# Patient Record
Sex: Male | Born: 1990 | Race: Black or African American | Hispanic: No | Marital: Single | Smoking: Current every day smoker
Health system: Southern US, Community
[De-identification: ages and names within clinical notes are randomized; demographics above are authoritative.]

---
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
## 2009-03-31 ENCOUNTER — Telehealth (INDEPENDENT_AMBULATORY_CARE_PROVIDER_SITE_OTHER): Payer: Self-pay | Admitting: *Deleted

## 2009-05-21 ENCOUNTER — Ambulatory Visit: Payer: Self-pay | Admitting: Family Medicine

## 2009-06-03 ENCOUNTER — Telehealth: Payer: Self-pay | Admitting: Family Medicine

## 2009-06-03 DIAGNOSIS — F319 Bipolar disorder, unspecified: Secondary | ICD-10-CM | POA: Insufficient documentation

## 2009-06-20 ENCOUNTER — Encounter (INDEPENDENT_AMBULATORY_CARE_PROVIDER_SITE_OTHER): Payer: Self-pay | Admitting: *Deleted

## 2009-06-20 DIAGNOSIS — F17200 Nicotine dependence, unspecified, uncomplicated: Secondary | ICD-10-CM

## 2010-06-14 ENCOUNTER — Encounter: Payer: Self-pay | Admitting: Family Medicine

## 2010-10-14 NOTE — Miscellaneous (Signed)
   Clinical Lists Changes  Problems: Changed problem from ASTHMA, UNSPECIFIED (ICD-493.90) to ASTHMA, INTERMITTENT (ICD-493.90) 

## 2012-05-24 ENCOUNTER — Emergency Department (INDEPENDENT_AMBULATORY_CARE_PROVIDER_SITE_OTHER)
Admission: EM | Admit: 2012-05-24 | Discharge: 2012-05-24 | Disposition: A | Discharge status: Home / Self Care | Payer: Self-pay | Source: Home / Self Care | Attending: Family Medicine | Admitting: Family Medicine

## 2012-05-24 ENCOUNTER — Encounter (HOSPITAL_COMMUNITY): Payer: Self-pay | Admitting: *Deleted

## 2012-05-24 DIAGNOSIS — R220 Localized swelling, mass and lump, head: Secondary | ICD-10-CM

## 2012-05-24 DIAGNOSIS — K130 Diseases of lips: Secondary | ICD-10-CM

## 2012-05-24 MED ORDER — IBUPROFEN 600 MG PO TABS: 600.0000 mg | ORAL_TABLET | Freq: Three times a day (TID) | ORAL | Status: AC

## 2012-05-24 MED ORDER — HYDROXYZINE HCL 25 MG PO TABS: 25.0000 mg | ORAL_TABLET | Freq: Four times a day (QID) | ORAL | Status: AC

## 2012-05-24 NOTE — ED Notes (Addendum)
Pt is here with complaints of swollen lower lip with onset yesterday.  Denies any known allergy or exposure.  Denies difficulty breathing or swallowing.
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
## 2016-12-02 ENCOUNTER — Encounter (HOSPITAL_COMMUNITY): Payer: Self-pay | Admitting: *Deleted

## 2016-12-02 ENCOUNTER — Emergency Department (HOSPITAL_COMMUNITY)
Admission: EM | Admit: 2016-12-02 | Discharge: 2016-12-02 | Disposition: A | Discharge status: Home / Self Care | Payer: Self-pay | Attending: Emergency Medicine | Admitting: Emergency Medicine

## 2016-12-02 DIAGNOSIS — R300 Dysuria: Secondary | ICD-10-CM | POA: Insufficient documentation

## 2016-12-02 DIAGNOSIS — J45909 Unspecified asthma, uncomplicated: Secondary | ICD-10-CM | POA: Insufficient documentation

## 2016-12-02 DIAGNOSIS — Z202 Contact with and (suspected) exposure to infections with a predominantly sexual mode of transmission: Secondary | ICD-10-CM | POA: Insufficient documentation

## 2016-12-02 DIAGNOSIS — F909 Attention-deficit hyperactivity disorder, unspecified type: Secondary | ICD-10-CM | POA: Insufficient documentation

## 2016-12-02 DIAGNOSIS — F17200 Nicotine dependence, unspecified, uncomplicated: Secondary | ICD-10-CM | POA: Insufficient documentation

## 2016-12-02 DIAGNOSIS — R369 Urethral discharge, unspecified: Secondary | ICD-10-CM | POA: Insufficient documentation

## 2016-12-02 MED ORDER — AZITHROMYCIN 250 MG PO TABS
1000.0000 mg | ORAL_TABLET | Freq: Once | ORAL | Status: AC
  Administered 2016-12-02: 1000 mg via ORAL
  Filled 2016-12-02: qty 4

## 2016-12-02 MED ORDER — CEFTRIAXONE SODIUM 250 MG IJ SOLR
250.0000 mg | Freq: Once | INTRAMUSCULAR | Status: AC
  Administered 2016-12-02: 250 mg via INTRAMUSCULAR
  Filled 2016-12-02: qty 250

## 2016-12-02 MED ORDER — STERILE WATER FOR INJECTION IJ SOLN
INTRAMUSCULAR | Status: AC
  Administered 2016-12-02: 2 mL
  Filled 2016-12-02: qty 10
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
## 2016-12-02 NOTE — ED Notes (Signed)
Pt stable, understands discharge instructions, and reasons for return.   

## 2016-12-02 NOTE — Discharge Instructions (Signed)
Follow up with the [facility] Department for further testing for Hepatitis, syphilis, HIV. The health department is free.

## 2016-12-02 NOTE — ED Provider Notes (Signed)
[facility]-EMERGENCY DEPT Provider Note   CSN: [id number] Arrival date & time: 12/02/16  1549  By signing my name below, I, Cameron Simpson, attest that this documentation has been prepared under the direction and in the presence of non-physician practitioner, Cameron BuffaloHope Neese, NP. Electronically Signed: Nelwyn SalisburyJoshua Simpson, Scribe. 12/02/2016. 4:08 PM.  History   Chief Complaint Chief Complaint  Patient presents with  . SEXUALLY TRANSMITTED DISEASE   The history is provided by the patient. No language interpreter was used.  Exposure to STD  This is a new problem. The current episode started more than 2 days ago. The problem occurs daily. The problem has not changed since onset.Associated symptoms include headaches. Pertinent negatives include no abdominal pain. Nothing aggravates the symptoms. Nothing relieves the symptoms. He has tried nothing for the symptoms. The treatment provided no relief.    HPI Comments:  Cameron Simpson is a 26 y.o. male who presents to the Emergency Department complaining of sudden-onset, intermittent penile discharge which he first noticed about a week ago. Pt describes his penile discharge as thick and yellow. He reports associated intermittent dysuria and headache. Pt's girlfriend was seen yesterday here in the ED and he states that she was dx with chlamydia. Pt notes that he has had unprotected sex with different partners. He denies any nausea, vomiting, fever, chills, abdominal pain, scrotal swelling, penile pain or back pain.    History reviewed. No pertinent past medical history.  Patient Active Problem List   Diagnosis Date Noted  . TOBACCO USER 06/20/2009  . BIPOLAR AFFECTIVE DISORDER 06/03/2009  . OBESITY, NOS 11/09/2006  . ATTENTION DEFICIT, W/O HYPERACTIVITY 11/09/2006  . ASTHMA, INTERMITTENT 11/09/2006    History reviewed. No pertinent surgical history.     Home Medications    Prior to Admission medications   Not on File    Family History History  reviewed. No pertinent family history.  Social History Social History  Substance Use Topics  . Smoking status: Current Every Day Smoker  . Smokeless tobacco: Not on file  . Alcohol use Yes     Allergies   Patient has no known allergies.   Review of Systems Review of Systems  Constitutional: Negative for chills and fever.  Gastrointestinal: Negative for abdominal pain, nausea and vomiting.  Genitourinary: Positive for discharge and dysuria. Negative for penile pain and scrotal swelling.  Neurological: Positive for headaches.     Physical Exam Updated Vital Signs BP 122/65 (BP Location: Left Arm)   Pulse 63   Temp 98.6 F (37 C) (Oral)   Resp 16   SpO2 97%   Physical Exam  Constitutional: He appears well-developed and well-nourished. No distress.  HENT:  Head: Normocephalic and atraumatic.  Eyes: EOM are normal.  Neck: Neck supple.  Cardiovascular: Normal rate and regular rhythm.   Pulmonary/Chest: Effort normal and breath sounds normal.  Abdominal: Soft. He exhibits no distension. There is no tenderness.  Genitourinary: Testes normal. Circumcised. No penile erythema or penile tenderness. Discharge found.  Genitourinary Comments: Circumcised male with clear discharge from penis. No inguinal lymphadenopathy.   Musculoskeletal: Normal range of motion.  Lymphadenopathy: No inguinal adenopathy noted on the right or left side.  Neurological: He is alert.  Skin: Skin is warm and dry.  Psychiatric: He has a normal mood and affect.  Nursing note and vitals reviewed.    ED Treatments / Results  DIAGNOSTIC STUDIES:  Oxygen Saturation is 99% on RA, normal by my interpretation.    COORDINATION OF CARE:  4:29  PM Discussed treatment plan with pt at bedside which includes discharge with abx (Rocefin and Zithromax) and pt agreed to plan.  Labs (all labs ordered are listed, but only abnormal results are displayed) Labs Reviewed  GC/CHLAMYDIA PROBE AMP ([place]) NOT  AT [facility]    Radiology No results found.  Procedures Procedures (including critical care time)  Medications Ordered in ED Medications  cefTRIAXone (ROCEPHIN) injection 250 mg (250 mg Intramuscular Given 12/02/16 1642)  azithromycin (ZITHROMAX) tablet 1,000 mg (1,000 mg Oral Given 12/02/16 1643)  sterile water (preservative free) injection (2 mLs  Given 12/02/16 1647)     Initial Impression / Assessment and Plan / ED Course  I have reviewed the triage vital signs and the nursing notes.   Final Clinical Impressions(s) / ED Diagnoses  26 y.o. male with dysuria and penile discharge stable for d/c to f/u with [facility]. Patient treated with Rocephin and Zithromax while in the ED. Cultures pending. Final diagnoses:  Penile discharge  Dysuria  STD exposure    New Prescriptions There are no discharge medications for this patient. I personally performed the services described in this documentation, which was scribed in my presence. The recorded information has been reviewed and is accurate.     [address] [place], [place] 12/03/16 1610    Jerelyn Scott, MD 12/03/16 [phone]

## 2016-12-02 NOTE — ED Triage Notes (Signed)
Pt reports having an std, has burning pain and penile discharge. No acute distress noted at triage.

## 2016-12-05 LAB — GC/CHLAMYDIA PROBE AMP (~~LOC~~) NOT AT ARMC
CHLAMYDIA, DNA PROBE: POSITIVE — AB
Neisseria Gonorrhea: NEGATIVE

## 2023-06-26 ENCOUNTER — Emergency Department (HOSPITAL_COMMUNITY)
Admission: EM | Admit: 2023-06-26 | Discharge: 2023-06-30 | Disposition: A | Discharge status: Home / Self Care | Payer: Self-pay | Attending: Emergency Medicine | Admitting: Emergency Medicine

## 2023-06-26 ENCOUNTER — Encounter (HOSPITAL_COMMUNITY): Payer: Self-pay | Admitting: Emergency Medicine

## 2023-06-26 ENCOUNTER — Other Ambulatory Visit: Payer: Self-pay

## 2023-06-26 DIAGNOSIS — F3110 Bipolar disorder, current episode manic without psychotic features, unspecified: Secondary | ICD-10-CM | POA: Diagnosis present

## 2023-06-26 DIAGNOSIS — R456 Violent behavior: Secondary | ICD-10-CM | POA: Insufficient documentation

## 2023-06-26 DIAGNOSIS — E876 Hypokalemia: Secondary | ICD-10-CM

## 2023-06-26 DIAGNOSIS — Z79899 Other long term (current) drug therapy: Secondary | ICD-10-CM | POA: Insufficient documentation

## 2023-06-26 DIAGNOSIS — R4689 Other symptoms and signs involving appearance and behavior: Secondary | ICD-10-CM

## 2023-06-26 DIAGNOSIS — F29 Unspecified psychosis not due to a substance or known physiological condition: Secondary | ICD-10-CM | POA: Insufficient documentation

## 2023-06-26 LAB — COMPREHENSIVE METABOLIC PANEL
ALT: 14 U/L (ref 0–44)
AST: 17 U/L (ref 15–41)
Albumin: 4.3 g/dL (ref 3.5–5.0)
Alkaline Phosphatase: 58 U/L (ref 38–126)
Anion gap: 8 (ref 5–15)
BUN: 11 mg/dL (ref 6–20)
CO2: 24 mmol/L (ref 22–32)
Calcium: 9.2 mg/dL (ref 8.9–10.3)
Chloride: 106 mmol/L (ref 98–111)
Creatinine, Ser: 1.17 mg/dL (ref 0.61–1.24)
GFR, Estimated: >60 mL/min (ref >60)
Glucose, Bld: 76 mg/dL (ref 70–99)
Potassium: 3.2 mmol/L — ABNORMAL LOW (ref 3.5–5.1)
Sodium: 138 mmol/L (ref 135–145)
Total Bilirubin: 1 mg/dL (ref 0.3–1.2)
Total Protein: 7.8 g/dL (ref 6.5–8.1)

## 2023-06-26 LAB — CBC
HCT: 46.9 % (ref 39.0–52.0)
Hemoglobin: 14.7 g/dL (ref 13.0–17.0)
MCH: 28.2 pg (ref 26.0–34.0)
MCHC: 31.3 g/dL (ref 30.0–36.0)
MCV: 90 fL (ref 80.0–100.0)
Platelets: 252 10*3/uL (ref 150–400)
RBC: 5.21 MIL/uL (ref 4.22–5.81)
RDW: 14 % (ref 11.5–15.5)
WBC: 7.1 10*3/uL (ref 4.0–10.5)
nRBC: 0 % (ref 0.0–0.2)

## 2023-06-26 LAB — RAPID URINE DRUG SCREEN, HOSP PERFORMED
Amphetamines: NOT DETECTED
Barbiturates: NOT DETECTED
Benzodiazepines: NOT DETECTED
Cocaine: NOT DETECTED
Opiates: NOT DETECTED
Tetrahydrocannabinol: POSITIVE — AB

## 2023-06-26 LAB — SALICYLATE LEVEL: Salicylate Lvl: <7 mg/dL — ABNORMAL LOW (ref 7.0–30.0)

## 2023-06-26 LAB — ACETAMINOPHEN LEVEL: Acetaminophen (Tylenol), Serum: <10 ug/mL — ABNORMAL LOW (ref 10–30)

## 2023-06-26 LAB — ETHANOL: Alcohol, Ethyl (B): <10 mg/dL (ref <10)

## 2023-06-26 MED ORDER — DIVALPROEX SODIUM 250 MG PO DR TAB
250.0000 mg | DELAYED_RELEASE_TABLET | Freq: Once | ORAL | Status: DC
  Filled 2023-06-26: qty 1

## 2023-06-26 MED ORDER — ZIPRASIDONE MESYLATE 20 MG IM SOLR
20.0000 mg | Freq: Once | INTRAMUSCULAR | Status: AC
  Administered 2023-06-26: 20 mg via INTRAMUSCULAR
  Filled 2023-06-26: qty 20

## 2023-06-26 MED ORDER — DIVALPROEX SODIUM ER 500 MG PO TB24
500.0000 mg | ORAL_TABLET | Freq: Every day | ORAL | Status: DC
  Administered 2023-06-26 – 2023-06-29 (×4): 500 mg via ORAL
  Filled 2023-06-26 (×4): qty 1

## 2023-06-26 MED ORDER — ONDANSETRON HCL 4 MG PO TABS: 4.0000 mg | ORAL_TABLET | Freq: Three times a day (TID) | ORAL | Status: DC | PRN

## 2023-06-26 MED ORDER — NICOTINE 14 MG/24HR TD PT24
14.0000 mg | MEDICATED_PATCH | Freq: Every day | TRANSDERMAL | Status: DC
  Filled 2023-06-26: qty 1

## 2023-06-26 MED ORDER — ALUM & MAG HYDROXIDE-SIMETH 200-200-20 MG/5ML PO SUSP: 30.0000 mL | Freq: Four times a day (QID) | ORAL | Status: DC | PRN

## 2023-06-26 MED ORDER — POTASSIUM CHLORIDE CRYS ER 20 MEQ PO TBCR
40.0000 meq | EXTENDED_RELEASE_TABLET | Freq: Once | ORAL | Status: AC
  Administered 2023-06-26: 40 meq via ORAL
  Filled 2023-06-26: qty 2

## 2023-06-26 MED ORDER — ACETAMINOPHEN 325 MG PO TABS
650.0000 mg | ORAL_TABLET | ORAL | Status: DC | PRN
  Administered 2023-06-27: 650 mg via ORAL
  Filled 2023-06-26: qty 2

## 2023-06-26 MED ORDER — RISPERIDONE 1 MG PO TABS
1.0000 mg | ORAL_TABLET | Freq: Every day | ORAL | Status: DC
  Administered 2023-06-26 – 2023-06-29 (×4): 1 mg via ORAL
  Filled 2023-06-26 (×4): qty 1

## 2023-06-26 MED ORDER — LORAZEPAM 1 MG PO TABS: 2.0000 mg | ORAL_TABLET | Freq: Once | ORAL | Status: DC

## 2023-06-26 MED ORDER — TRAZODONE HCL 50 MG PO TABS
50.0000 mg | ORAL_TABLET | Freq: Every evening | ORAL | Status: DC | PRN
  Administered 2023-06-26 – 2023-06-29 (×4): 50 mg via ORAL
  Filled 2023-06-26 (×4): qty 1
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
## 2023-06-26 NOTE — ED Provider Notes (Signed)
Emergency Medicine Observation Re-evaluation Note  Cameron Simpson is a 32 y.o. male, seen on rounds today.  Pt initially presented to the ED for complaints of Psychiatric Evaluation Currently, the patient is awaiting psychiatric evaluation.  Physical Exam  BP (!) 158/94 (BP Location: Right Arm)   Pulse 88   Temp 98.5 F (36.9 C) (Oral)   Resp 18   Ht 5' 11.5" (1.816 m)   Wt 97.6 kg   SpO2 96%   BMI 29.59 kg/m  Physical Exam    ED Course / MDM  EKG:   I have reviewed the labs performed to date as well as medications administered while in observation.  Recent changes in the last 24 hours include none.  Plan  Current plan is for psychiatric eval.    Bethann Berkshire, MD 06/26/23 [phone]

## 2023-06-26 NOTE — Progress Notes (Signed)
LCSW Progress Note  [id number]   Cameron Simpson  06/26/2023  1:56 PM  Description:   Inpatient Psychiatric Referral  Patient was recommended inpatient per Dahlia Byes, NP. There are no available beds at [facility], per [facility] [name], RN. Patient was referred to the following out of network facilities:   Destination  Service Provider Address Phone Fax  [facility]  [address], [place] [address] [phone]  [facility] [address]  [address], [place] [address] [phone]  [facility]  [address] [place], [place] [address] [phone]  [facility]  [address] [place], [place] [address] [phone]  [facility]  [address] [place] [address] [phone]  [facility]  [address] [place], [place] [address] [phone]  [facility]  [address] [place] [address] [phone]  [facility]  [address] [place] [address] [phone]  [facility]  [address] [place] [address] [phone]  [facility]  [address] [place] [address] [phone]  [facility]  [address] [place] [address] [phone]  [facility]  [place] [phone]  [facility] EFAX  [address] [place], [place] [phone]  [facility]  [address], [place] [address] [phone]  [facility]  [address] [place] [address] [phone]  [facility]  [address] [place], [place] [address] [phone]  [phone]  [facility]  [address] [place] [address] [phone]  [facility] EFAX  [place] [phone]    Situation ongoing, CSW to continue following and update chart as more information becomes available.      Cathie Beams, MSW, LCSW  06/26/2023 1:56 PM

## 2023-06-26 NOTE — ED Notes (Signed)
[facility] received a call from pts mother saying that she had gone to court to request a protective order which was approved. Pt is expected to be served here in the ED by [employer]. Pts mother would like to be notified of pts admission to an inpatient facility and discharge.  Jacquelynn Cree, [facility]  06/26/23
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
## 2023-06-26 NOTE — ED Notes (Signed)
[facility] called pts probation officer (Officer Godwin: [phone]) for collateral and to inform him that pt is here in the ED. The probation voicemail system is currently down and not able to accept messages. [facility] will try again.   Jacquelynn Cree, [facility]  06/26/23

## 2023-06-26 NOTE — ED Notes (Signed)
ED Provider at bedside. 

## 2023-06-26 NOTE — ED Notes (Signed)
Pt extremely aggressive, stating he will leave. Pt currently under IVC. Yelling in hall outside of room, cussing at staff. Security present. Medication orders obtained from [facility] MD. Pt stating he will spit at staff during administration of med.
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
## 2023-06-26 NOTE — ED Notes (Signed)
Unable to complete TTS as this time due to medication administration and pt aggressive behavior.

## 2023-06-26 NOTE — ED Notes (Signed)
Pt refusing to provide urine at this time. Using profanity towards this RN and police as bedside. Belongings collected per protocol. Pt aggressive and uncooperative at this time.
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
## 2023-06-26 NOTE — BH Assessment (Signed)
Per RN, Pt received medication for aggression and is currently unable to participate in tele-assessment.   Pamalee Leyden, [facility], [facility] Triage Specialist

## 2023-06-26 NOTE — ED Notes (Signed)
[employer] no longer at bedside. Pt agreeable to remain calm and cooperative at this time. Water given to pt per request.
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
## 2023-06-27 DIAGNOSIS — R4689 Other symptoms and signs involving appearance and behavior: Secondary | ICD-10-CM
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
## 2023-06-27 NOTE — ED Notes (Signed)
[facility] called Officer Courtney Paris, pts probation officer to inform them that pt is in the ED and is recommended for inpatient admission. The voicemail system at probation is still not working. [facility] was unable to leave a message. Will try again.   Jacquelynn Cree, [facility]   06/27/23

## 2023-06-27 NOTE — Progress Notes (Signed)
LCSW Progress Note  [id number]   Cameron Simpson  06/27/2023  10:55 AM  Description:   Inpatient Psychiatric Referral  Patient was recommended inpatient per Dahlia Byes, NP. There are no available beds at [facility], per [facility] [name], RN. Patient was referred to the following out of network facilities:   Destination  Service Provider Address Phone Fax  [facility]  [address] [place] [address] [phone]  [facility] [address]  [address], [place] [address] [phone]  [facility]  [address] [place], [place] [address] [phone]  [facility]  [address] [place], [place] [address] [phone]  [facility]  [address], [place] [address] [phone]  [facility]  [address] [place], [place] [address] [phone]  [facility]  [address] [place] [address] [phone]  [facility]  [address] [place] [address] [phone]  [facility]  [address] [place] [address] [phone]  [facility]  [address] [place] [address] [phone]  [facility]  [address] [place] [address] [phone]  [facility]  [place] [phone]  [facility]  [address] [place], [place] [phone]  [facility]  [address] [place] [address] [phone]  [facility]  [address], [place] [address] [phone]  [facility]  [address] [place], [place] [address] [phone]  [facility]  [address] [place] [address] [phone]  [facility] Inpatient EFAX  [place] [phone]    Situation ongoing, CSW to continue following and update chart as more information becomes available.    Cathie Beams, MSW, LCSW  06/27/2023 10:55 AM

## 2023-06-27 NOTE — ED Provider Notes (Signed)
Emergency Medicine Observation Re-evaluation Note  Cameron Simpson is a 32 y.o. male, seen on rounds today.  Pt initially presented to the ED for complaints of Psychiatric Evaluation Currently, the patient is asleep in bed.  Physical Exam  BP 129/77 (BP Location: Right Arm)   Pulse (!) 58   Temp 98.5 F (36.9 C) (Oral)   Resp 18   Ht 5' 11.5" (1.816 m)   Wt 97.6 kg   SpO2 100%   BMI 29.59 kg/m  Physical Exam General: Asleep, no acute distress Cardiac: Regular rate Lungs: No increased work of breathing Psych: Calm, sleep  ED Course / MDM  EKG:   I have reviewed the labs performed to date as well as medications administered while in observation.  Recent changes in the last 24 hours include patient remains medically cleared.  Recommended for inpatient psych, pending placement.  Plan  Current plan is for inpatient psych.    Rexford Maus, DO 06/27/23 [phone]

## 2023-06-28 NOTE — Progress Notes (Addendum)
ADDENDUM  2:35 PM - CSW received return phone call from Joe, intake coordinator, at [facility]. Pt has been denied by [facility] due to high acuity. CSW will continue to assist with placement.  12:36 PM - CSW received phone call from Joe, intake coordinator, at [facility]. Pt is currently under review for inpatient treatment. CSW sent IVC paperwork via fax to Joe for review. CSW will await follow up from Joe.  Cathie Beams, MSW, LCSW  06/28/2023 12:37 PM

## 2023-06-28 NOTE — Progress Notes (Signed)
LCSW Progress Note  [id number]   Cameron Simpson  06/28/2023  11:05 AM  Description:   Inpatient Psychiatric Referral  Patient was recommended inpatient per Phebe Colla, NP. There are no available beds at [facility], per [facility] [name], RN. Patient was referred to the following out of network facilities:   Destination  Service Provider Address Phone Fax  [facility]  [address], [place] [address] [phone]  [facility] [address]  [address] [place] [address] [phone]  [facility]  [address] [place], [place] [address] [phone]  [facility]  [address] [place], [place] [address] [phone]  [facility]  [address], [place] [address] [phone]  [facility]  [address] [place], [place] [address] [phone]  [facility]  [address] [place] [address] [phone]  [facility]  [address] [place] [address] [phone]  [facility]  [address], [place] [address] [phone]  [facility]  [address], [place] [address] [phone]  [facility]  [address] [place] [address] [phone]  [facility] B[facility] Management Behavioral Health  [place] [phone]  [facility] EFAX  [address] [place], [place] [phone]  [facility]  [address], [place] [address] [phone]  [facility]  [address] [place] [address] [phone]  [facility]  [address] [place], [place] [address] [phone]   [facility]  [address], [place] [address] [phone]  [facility] Psych[facility] Inpatient EFAX  [place] [phone]    Situation ongoing, CSW to continue following and update chart as more information becomes available.      Cathie Beams, MSW, LCSW  06/28/2023 11:05 AM

## 2023-06-28 NOTE — ED Notes (Signed)
Pt very cooperative with medication compliance, even inquired if these would be the medications he goes home with at d/c. Pt has had no behavior issues this shift.

## 2023-06-28 NOTE — ED Notes (Signed)
Patient is calm and cooperative, laying in bed. Awaiting psych MD rounds this Am.
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
## 2023-06-28 NOTE — ED Provider Notes (Signed)
Emergency Medicine Observation Re-evaluation Note  Cameron Simpson is a 32 y.o. male, seen on rounds today.  Pt initially presented to the ED for complaints of Psychiatric Evaluation Currently, the patient is sleeping.  Physical Exam  BP 106/69 (BP Location: Right Arm)   Pulse (!) 56   Temp 97.8 F (36.6 C) (Oral)   Resp 18   Ht 5' 11.5" (1.816 m)   Wt 97.6 kg   SpO2 100%   BMI 29.59 kg/m  Physical Exam General: Sleeping, no acute distress Lungs: No respiratory distress Psych: Resting comfortably  ED Course / MDM  EKG:   I have reviewed the labs performed to date as well as medications administered while in observation.  Recent changes in the last 24 hours include no acute events.  Plan  Current plan is for psychiatric placement per psychiatry recommendation.    Durwin Glaze, MD 06/28/23 [phone]
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
## 2023-06-29 NOTE — ED Provider Notes (Signed)
Emergency Medicine Observation Re-evaluation Note  Cameron Simpson is a 32 y.o. male, seen on rounds today.  Pt initially presented to the ED for complaints of Psychiatric Evaluation Currently, the patient is resting.  Physical Exam  BP 115/67 (BP Location: Right Arm)   Pulse (!) 59   Temp 98.5 F (36.9 C) (Oral)   Resp 18   Ht 1.816 m (5' 11.5")   Wt 97.6 kg   SpO2 100%   BMI 29.59 kg/m  Physical Exam General: nad   ED Course / MDM  EKG:   I have reviewed the labs performed to date as well as medications administered while in observation.  Recent changes in the last 24 hours include no events.  Plan  Current plan is for inpt tx.    Linwood Dibbles, MD 06/29/23 [phone]

## 2023-06-29 NOTE — Progress Notes (Signed)
LCSW Progress Note  [id number]   Cameron Simpson  06/29/2023  1:40 AM    Inpatient Behavioral Health Placement  CSW sent referral to out of network providers:  Destination  Service Provider Address Phone Fax  [facility]  [address] [place] [address] [phone]  [facility] [address]  [address] [place] [address] [phone]  [facility] [address] [place]  [address] [place], [place] [address] [phone]  [facility]  [address] [place], [place] [address] [phone]  [facility]  [address], [place] [address] [phone]  [facility]  [address] [place], [place] [address] [phone]  [facility]  [address] [place] [address] [phone]  [facility]  [address] [place] [address] [phone]  [facility]  [address] [place] [address] [phone]  [facility]  [address] [place] [address] [phone]  [facility]  [address] [place] [address] [phone]  [facility]  [place] [phone]  [facility] EFAX  [address] [place], [place] [phone]  [facility]  [address] [place] [address] [phone]  [facility]  [address], [place] [address] [phone]  [facility]  [address] [place] [address] [phone]  [facility]  [address] [place] [address] [phone]  [facility]  Psychiatry Inpatient [place]  [place] [phone]  [facility] Patient Placement  [facility] [place], [place] [phone]  [facility]  [address] [place] [address] [phone]  [facility]  [address], [place] [address] [phone]  [facility]  [address] [place], [place] [address] [phone]  [facility]  [address], [place] [address] [phone]  [facility]  [address], [place] [address] [phone]  [facility]  [address], [place] [address] [phone]    Situation ongoing,  CSW will follow up.    Maryjean Ka, MSW, LCSWA 06/29/2023 1:40 AM

## 2023-06-30 DIAGNOSIS — F319 Bipolar disorder, unspecified: Secondary | ICD-10-CM

## 2023-06-30 NOTE — Discharge Instructions (Addendum)
Discharge recommendations:  Patient is to take medications as prescribed. Please see information for follow-up appointment with psychiatry and therapy. Please follow up with your primary care provider for all medical related needs.   Therapy: We recommend that patient participate in individual therapy to address mental health concerns.  Medications: The patient or guardian is to contact a medical professional and/or outpatient provider to address any new side effects that develop. The patient or guardian should update outpatient providers of any new medications and/or medication changes.   Atypical antipsychotics: If you are prescribed an atypical antipsychotic, it is recommended that your height, weight, BMI, blood pressure, fasting lipid panel, and fasting blood sugar be monitored by your outpatient providers.  Safety:  The patient should abstain from use of illicit substances/drugs and abuse of any medications. If symptoms worsen or do not continue to improve or if the patient becomes actively suicidal or homicidal then it is recommended that the patient return to the closest hospital emergency department, the [facility], or call 911 for further evaluation and treatment. National Suicide Prevention Lifeline 1-800-SUICIDE or 231-710-7121.  About 988 988 offers 24/7 access to trained crisis counselors who can help people experiencing mental health-related distress. People can call or text 988 or chat 988lifeline.org for themselves or if they are worried about a loved one who may need crisis support.  Crisis Mobile: [facility]:                     (508)170-1118 (for crisis response 24 hours a day) [facility] Hotline:                                            (360) 088-6544   Safety Plan ALEKI HAFEN will reach out to his grandmother Mrs Kearney Hard., call 911 or call mobile crisis, or go to nearest emergency room if condition worsens or if  suicidal thoughts become active Patients' will follow up with behavioral health urgent care for outpatient psychiatric services (therapy/medication management).  The suicide prevention education provided includes the following: Suicide risk factors Suicide prevention and interventions National Suicide Hotline telephone number [facility] assessment telephone number [facility] Emergency Assistance 911 [facility] and/or Residential Mobile Crisis Unit telephone number Request made of family/significant other to:  grandmother Mrs Gena Fray weapons (e.g., guns, rifles, knives), all items previously/currently identified as safety concern.   Remove drugs/medications (over the counter, prescriptions, illicit drugs), all items previously/currently identified as a safety concern.

## 2023-06-30 NOTE — ED Notes (Signed)
Pts mother called [facility] to inquire about pts disposition. Pts mother spoke with pts grandmother who has agreed to house pt upon discharge. Pts mother is fine with the arrangement. [facility] assured pts mother that it would be reiterated to pt that he is not allowed to have contact with his mother due to the protective order being place and also will be encouraged to follow up with outpatient treatment and medication.   Pts mother said that she has been diagnosed bipolar so she understands the importance of taking medication. Pts mother was appreciative of the information.  Jacquelynn Cree, [facility]  06/30/23

## 2023-06-30 NOTE — ED Provider Notes (Signed)
Emergency Medicine Observation Re-evaluation Note  Cameron Simpson is a 32 y.o. male, seen on rounds today.  Pt initially presented to the ED for complaints of Psychiatric Evaluation Currently, the patient is medically cleared awaiting inpatient psychiatric admission.  Physical Exam  BP 101/68 (BP Location: Right Arm)   Pulse 60   Temp 98.1 F (36.7 C) (Oral)   Resp 20   Ht 5' 11.5" (1.816 m)   Wt 97.6 kg   SpO2 100%   BMI 29.59 kg/m  Physical Exam Vitals and nursing note reviewed.  Constitutional:      General: He is not in acute distress.    Appearance: He is well-developed. He is not diaphoretic.  HENT:     Head: Normocephalic and atraumatic.  Eyes:     Extraocular Movements: EOM normal.     Conjunctiva/sclera: Conjunctivae normal.  Cardiovascular:     Rate and Rhythm: Normal rate and regular rhythm.     Heart sounds: Normal heart sounds. No murmur heard.    No friction rub. No gallop.  Pulmonary:     Effort: Pulmonary effort is normal. No respiratory distress.     Breath sounds: Normal breath sounds. No wheezing or rales.  Abdominal:     General: There is no distension.     Palpations: Abdomen is soft.     Tenderness: There is no abdominal tenderness. There is no guarding.  Musculoskeletal:        General: No edema.     Cervical back: Normal range of motion.  Skin:    General: Skin is warm and dry.  Neurological:     Mental Status: He is alert and oriented to person, place, and time.    General: NAD Cardiac: RR Lungs: Even, unlabored Psych: calm  ED Course / MDM  EKG:   I have reviewed the labs performed to date as well as medications administered while in observation.  Recent changes in the last 24 hours include none.  Plan  Current plan is for inpatient psychiatric admission.    Psychiatry reevaluated and now feel he is stable for outpatient treatment> He has contracted for safety.    Alvira Monday, MD 06/30/23 1331

## 2023-06-30 NOTE — ED Notes (Signed)
[facility] provided resources to pt for shelter, free meals, food pantries, and outpatient health and mental health treatment.   Jacquelynn Cree, [facility]   06/30/23

## 2023-06-30 NOTE — Discharge Summary (Signed)
[facility] Psych ED Discharge  06/30/2023 12:42 PM Cameron Simpson  MRN:  [id number]  Principal Problem: Bipolar affective disorder, manic [facility]) Discharge Diagnoses: Principal Problem:   Bipolar affective disorder, manic (HCC) Active Problems:   Aggressive behavior  Clinical Impression:  Final diagnoses:  Aggressive behavior  Hypokalemia     Subjective: Cameron Simpson, 32 y.o., male patient seen face to face by this provider, consulted with Dr. Lucianne Muss; and chart reviewed on 06/30/23.  On evaluation Cameron Simpson is sitting on the side of his bed. He is calm and cooperative during this assessment. His appearance is appropriate for environment. His eye contact is good. Speech is clear and coherent, normal pace and normal volume. He is alert and oriented x4 to person, place, time, and situation. He reports his mood is euthymic.  Affect is congruent with mood. Thought process is coherent. Thought content is within normal limits. He denies auditory and visual hallucinations. No indication that he is responding to internal stimuli during this assessment.  No delusions elicited during this assessment. He denies suicidal ideations.  He denies homicidal ideations. Appetite and sleep are good.  Patient states he will get in contact with his parole officer and see what resources he has to offer. Patient will get in contact with his grandmother Mrs. Dover for temporary housing.     ED Assessment Time Calculation: Start Time: 1000 Stop Time: 1015 Total Time in Minutes (Assessment Completion): 15   Past Psychiatric History: bipolar  Past Medical History: History reviewed. No pertinent past medical history. History reviewed. No pertinent surgical history. Family History: History reviewed. No pertinent family history.  Social History:  Social History   Substance and Sexual Activity  Alcohol Use Yes     Social History   Substance and Sexual Activity  Drug Use No    Social History   Socioeconomic  History   Marital status: Single    Spouse name: Not on file   Number of children: Not on file   Years of education: Not on file   Highest education level: Not on file  Occupational History   Not on file  Tobacco Use   Smoking status: Every Day   Smokeless tobacco: Not on file  Substance and Sexual Activity   Alcohol use: Yes   Drug use: No   Sexual activity: Not on file  Other Topics Concern   Not on file  Social History Narrative   Not on file   Social Determinants of Health   Financial Resource Strain: Not on file  Food Insecurity: Not on file  Transportation Needs: Not on file  Physical Activity: Not on file  Stress: Not on file  Social Connections: Not on file    Tobacco Cessation:  A prescription for an FDA-approved tobacco cessation medication was offered at discharge and the patient refused  Current Medications: Current Facility-Administered Medications  Medication Dose Route Frequency Provider Last Rate Last Admin   acetaminophen (TYLENOL) tablet 650 mg  650 mg Oral Q4H PRN Dione Booze, MD   650 mg at 06/27/23 1706   alum & mag hydroxide-simeth (MAALOX/MYLANTA) 200-200-20 MG/5ML suspension 30 mL  30 mL Oral Q6H PRN Dione Booze, MD       divalproex (DEPAKOTE ER) 24 hr tablet 500 mg  500 mg Oral QHS Onuoha, Josephine C, NP   500 mg at 06/29/23 2058   divalproex (DEPAKOTE) DR tablet 250 mg  250 mg Oral Once Earney Navy, NP  LORazepam (ATIVAN) tablet 2 mg  2 mg Oral Once Dione Booze, MD       nicotine (NICODERM CQ - dosed in mg/24 hours) patch 14 mg  14 mg Transdermal Daily Dione Booze, MD       ondansetron [facility]) tablet 4 mg  4 mg Oral Q8H PRN Dione Booze, MD       risperiDONE (RISPERDAL) tablet 1 mg  1 mg Oral QHS Onuoha, Josephine C, NP   1 mg at 06/29/23 2058   traZODone (DESYREL) tablet 50 mg  50 mg Oral QHS PRN Dahlia Byes C, NP   50 mg at 06/29/23 2058   No current outpatient medications on file.   PTA Medications: (Not in a  hospital admission)   [place] Scale:  Flowsheet Row ED from 06/26/2023 in [facility] Emergency Department at [facility]  C-SSRS RISK CATEGORY No Risk       Musculoskeletal: Strength & Muscle Tone: within normal limits Gait & Station: normal Patient leans: N/A  Psychiatric Specialty Exam: Presentation  General Appearance:  Appropriate for Environment  Eye Contact: Good  Speech: Clear and Coherent  Speech Volume: Normal  Handedness: Right   Mood and Affect  Mood: Euthymic  Affect: Appropriate   Thought Process  Thought Processes: Coherent  Descriptions of Associations:Intact  Orientation:Full (Time, Place and Person)  Thought Content:WDL  History of Schizophrenia/Schizoaffective disorder:No data recorded Duration of Psychotic Symptoms:No data recorded Hallucinations:Hallucinations: None  Ideas of Reference:None  Suicidal Thoughts:Suicidal Thoughts: No  Homicidal Thoughts:Homicidal Thoughts: No   Sensorium  Memory: Immediate Good; Recent Good  Judgment: Fair  Insight: Good   Executive Functions  Concentration: Good  Attention Span: Good  Recall: Good  Fund of Knowledge: Good  Language: Good   Psychomotor Activity  Psychomotor Activity: Psychomotor Activity: Normal   Assets  Assets: Communication Skills; Desire for Improvement; Social Support; Housing; Physical Health; Leisure Time   Sleep  Sleep: Sleep: Good    Physical Exam: Physical Exam Vitals and nursing note reviewed. Exam conducted with a chaperone present.  Neurological:     Mental Status: He is alert.  Psychiatric:        Mood and Affect: Mood normal.        Behavior: Behavior normal.        Thought Content: Thought content normal.        Judgment: Judgment normal.    Review of Systems  Psychiatric/Behavioral: Negative.     Blood pressure 101/68, pulse 60, temperature 98.1 F (36.7 C), temperature source Oral, resp. rate 20,  height 5' 11.5" (1.816 m), weight 97.6 kg, SpO2 100%. Body mass index is 29.59 kg/m.   Demographic Factors:  Male and Unemployed  Loss Factors: Financial problems/change in socioeconomic status  Historical Factors: Impulsivity  Risk Reduction Factors:   Sense of responsibility to family and Positive social support  Continued Clinical Symptoms:  Bipolar Disorder:   Mixed State  Suicide Risk:  Minimal: No identifiable suicidal ideation.  Patients presenting with no risk factors but with morbid ruminations; may be classified as minimal risk based on the severity of the depressive symptoms   Follow-up Information     [facility] Follow up.   Specialty: Urgent Care Why: As needed, If symptoms worsen Contact information: [address] [place] [address] [phone]                Medical Decision Making: Patient is psychiatrically cleared. Patient case review and discussed with Dr. Lucianne Muss,  and patient does not meet inpatient criteria for inpatient psychiatric treatment. At time of discharge, patient denies SI, HI, AVH and can contract for safety. He demonstrated no overt evidence of psychosis or mania. Prior to discharge, he verbalized that they understood warning signs, triggers, and symptoms of worsening mental health and how to access emergency mental health care if they felt it was needed. Patient was instructed to call 911 or return to the emergency room if they experienced any concerning symptoms after discharge. Patient voiced understanding and agreed to the above.  Patient given resources to follow up with behavioral health urgent care for therapy and medication management. Patient denies access to weapons. Safety planning completed.    Safety Plan Cameron Simpson will reach out to his grandmother Mrs Kearney Hard., call 911 or call mobile crisis, or go to nearest emergency room if condition worsens or if suicidal thoughts become  active Patients' will follow up with behavioral health urgent care for outpatient psychiatric services (therapy/medication management).  The suicide prevention education provided includes the following: Suicide risk factors Suicide prevention and interventions National Suicide Hotline telephone number [facility] assessment telephone number [facility] Emergency Assistance 911 [facility] and/or Residential Mobile Crisis Unit telephone number Request made of family/significant other to:  grandmother Mrs Gena Fray weapons (e.g., guns, rifles, knives), all items previously/currently identified as safety concern.   Remove drugs/medications (over the counter, prescriptions, illicit drugs), all items previously/currently identified as a safety concern.      Disposition: Patient does not meet criteria for psychiatric inpatient admission. Supportive therapy provided about ongoing stressors. Discussed crisis plan, support from social network, calling 911, coming to the Emergency Department, and calling Suicide Hotline.   Samaiyah Howes MOTLEY-MANGRUM, PMHNP 06/30/2023, 12:42 PM

## 2023-07-03 NOTE — Plan of Care (Signed)
[facility] Tonsillectomy/Adenoidectomy, Postoperative PEDS care plan entered in error.

## 2023-12-14 ENCOUNTER — Emergency Department (HOSPITAL_COMMUNITY): Payer: Self-pay

## 2023-12-14 ENCOUNTER — Other Ambulatory Visit (HOSPITAL_COMMUNITY): Payer: Self-pay

## 2023-12-14 ENCOUNTER — Other Ambulatory Visit: Payer: Self-pay

## 2023-12-14 ENCOUNTER — Emergency Department (HOSPITAL_COMMUNITY)
Admission: EM | Admit: 2023-12-14 | Discharge: 2023-12-14 | Disposition: A | Discharge status: Home / Self Care | Payer: Self-pay | Attending: Emergency Medicine | Admitting: Emergency Medicine

## 2023-12-14 DIAGNOSIS — S51802A Unspecified open wound of left forearm, initial encounter: Secondary | ICD-10-CM | POA: Insufficient documentation

## 2023-12-14 DIAGNOSIS — Z23 Encounter for immunization: Secondary | ICD-10-CM | POA: Insufficient documentation

## 2023-12-14 DIAGNOSIS — T148XXA Other injury of unspecified body region, initial encounter: Secondary | ICD-10-CM

## 2023-12-14 DIAGNOSIS — M7989 Other specified soft tissue disorders: Secondary | ICD-10-CM | POA: Insufficient documentation

## 2023-12-14 MED ORDER — CEFAZOLIN SODIUM 1 G IM
1.0000 g | Freq: Once | INTRAMUSCULAR | Status: AC
  Administered 2023-12-14: 1 g via INTRAMUSCULAR
  Filled 2023-12-14: qty 1000

## 2023-12-14 MED ORDER — LIDOCAINE-EPINEPHRINE (PF) 2 %-1:200000 IJ SOLN
20.0000 mL | Freq: Once | INTRAMUSCULAR | Status: AC
  Administered 2023-12-14: 20 mL

## 2023-12-14 MED ORDER — CEPHALEXIN 500 MG PO CAPS
500.0000 mg | ORAL_CAPSULE | Freq: Four times a day (QID) | ORAL | 0 refills | Status: AC
  Filled 2023-12-14: qty 20, 5d supply, fill #0

## 2023-12-14 MED ORDER — CEFAZOLIN SODIUM-DEXTROSE 1-4 GM/50ML-% IV SOLN: 1.0000 g | Freq: Once | INTRAVENOUS | Status: DC

## 2023-12-14 MED ORDER — TETANUS-DIPHTH-ACELL PERTUSSIS 5-2.5-18.5 LF-MCG/0.5 IM SUSY
0.5000 mL | PREFILLED_SYRINGE | Freq: Once | INTRAMUSCULAR | Status: AC
  Administered 2023-12-14: 0.5 mL via INTRAMUSCULAR

## 2023-12-14 MED ORDER — STERILE WATER FOR INJECTION IJ SOLN
INTRAMUSCULAR | Status: AC
  Administered 2023-12-14: 10 mL
  Filled 2023-12-14: qty 10
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
## 2023-12-14 NOTE — Progress Notes (Signed)
 Orthopedic Tech Progress Note Patient Details:  Cameron Simpson 01-01-1991 [id number]  Ortho Devices Type of Ortho Device: Post (short arm) splint Ortho Device/Splint Location: LUE Ortho Device/Splint Interventions: Ordered, Application   Post Interventions Patient Tolerated: Well Instructions Provided: Adjustment of device, Care of device  Tonye Pearson 12/14/2023, 4:59 AM
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
## 2023-12-14 NOTE — ED Provider Notes (Signed)
.  Laceration Repair  Date/Time: 12/14/2023 4:20 AM  Performed by: Judithann Sheen, PA Authorized by: Judithann Sheen, PA   Consent:    Consent obtained:  Verbal   Consent given by:  Patient   Risks, benefits, and alternatives were discussed: yes     Risks discussed:  Infection, pain, poor cosmetic result and poor wound healing   Alternatives discussed: vs sutures vs dermabond. Universal protocol:    Patient identity confirmed:  Verbally with patient and arm band Anesthesia:    Anesthesia method:  Local infiltration   Local anesthetic:  Lidocaine 1% WITH epi Laceration details:    Location:  Shoulder/arm   Shoulder/arm location:  L lower arm   Length (cm):  2 Pre-procedure details:    Preparation:  Patient was prepped and draped in usual sterile fashion and imaging obtained to evaluate for foreign bodies Treatment:    Area cleansed with:  Saline   Amount of cleaning:  Standard   Irrigation solution:  Sterile saline   Irrigation volume:  500   Irrigation method:  Pressure wash Skin repair:    Repair method:  Staples   Number of staples:  5 Approximation:    Approximation:  Close Repair type:    Repair type:  Simple Post-procedure details:    Dressing:  Open (no dressing)   Procedure completion:  Tolerated well, no immediate complications    Judithann Sheen, PA 12/14/23 0421    Shon Baton, MD 12/15/23 [phone]
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
## 2023-12-25 ENCOUNTER — Other Ambulatory Visit (HOSPITAL_COMMUNITY): Payer: Self-pay
